# Patient Record
Sex: Male | Born: 1965 | Race: Black or African American | Hispanic: No | Smoking: Never smoker
Health system: Southern US, Community
[De-identification: ages and names within clinical notes are randomized; demographics above are authoritative.]

## PROBLEM LIST (undated history)

## (undated) DIAGNOSIS — E119 Type 2 diabetes mellitus without complications: Secondary | ICD-10-CM

## (undated) HISTORY — PX: FOOT SURGERY: SHX648

---

## 2000-12-04 ENCOUNTER — Emergency Department (HOSPITAL_COMMUNITY): Admission: EM | Admit: 2000-12-04 | Discharge: 2000-12-04 | Payer: Self-pay | Admitting: Emergency Medicine

## 2000-12-04 ENCOUNTER — Encounter: Payer: Self-pay | Admitting: Emergency Medicine

## 2014-03-24 ENCOUNTER — Encounter: Payer: Self-pay | Admitting: Internal Medicine

## 2014-05-10 ENCOUNTER — Encounter: Payer: Self-pay | Admitting: Internal Medicine

## 2014-07-18 ENCOUNTER — Other Ambulatory Visit: Payer: Self-pay | Admitting: Specialist

## 2014-07-18 DIAGNOSIS — M7122 Synovial cyst of popliteal space [Baker], left knee: Secondary | ICD-10-CM

## 2014-07-25 ENCOUNTER — Ambulatory Visit
Admission: RE | Admit: 2014-07-25 | Discharge: 2014-07-25 | Disposition: A | Discharge status: Home / Self Care | Payer: 59 | Source: Ambulatory Visit | Attending: Specialist | Admitting: Specialist

## 2014-07-25 DIAGNOSIS — M7122 Synovial cyst of popliteal space [Baker], left knee: Secondary | ICD-10-CM

## 2016-03-09 IMAGING — US US ASPIRATION
1 series · 13 of 14 positions shown · non-contrast
Comparison: none

CLINICAL DATA: 48-year-old male with posteromedial left knee pain.
Ganglion cyst adjacent to a likely chronic posteromedial corner
injury noted on prior outside MRI. Request for aspiration and
steroid injection.

EXAM:
US ASPIRATION/INJECTION
Date: 07/25/2014
PROCEDURE:
1. Ultrasound-guided aspiration of left knee ganglion cyst
2. Injection of steroid into the ganglion cyst
ANESTHESIA/SEDATION:
None required
MEDICATIONS:
80 mg Depo-Medrol
TECHNIQUE: Informed consent was obtained from the patient following explanation
of the procedure, risks, benefits and alternatives. The patient
understands, agrees and consents for the procedure. All questions
were addressed. A time out was performed.

[Series 1: us aspiration · 0.06mm/px · 14 acquisitions, 13 frames shown]
[im 1/14]
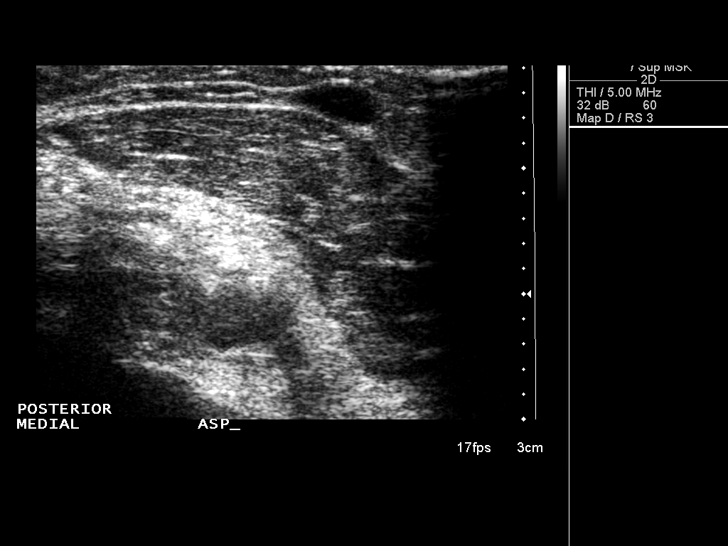
[im 2/14]
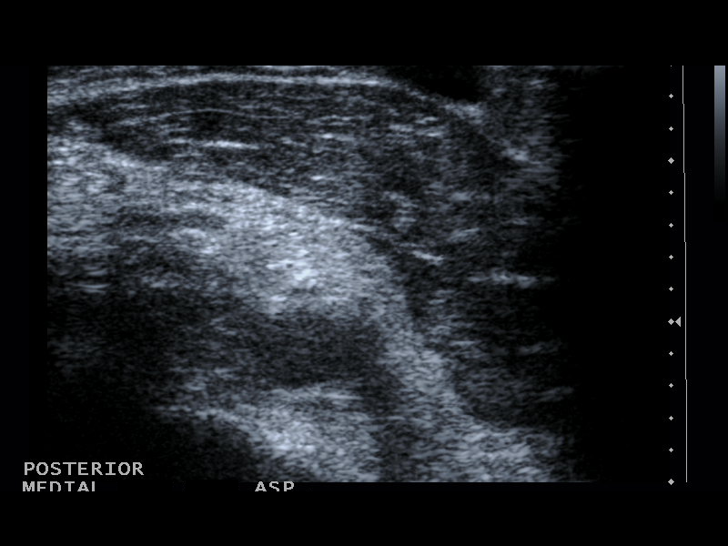
[im 3/14]
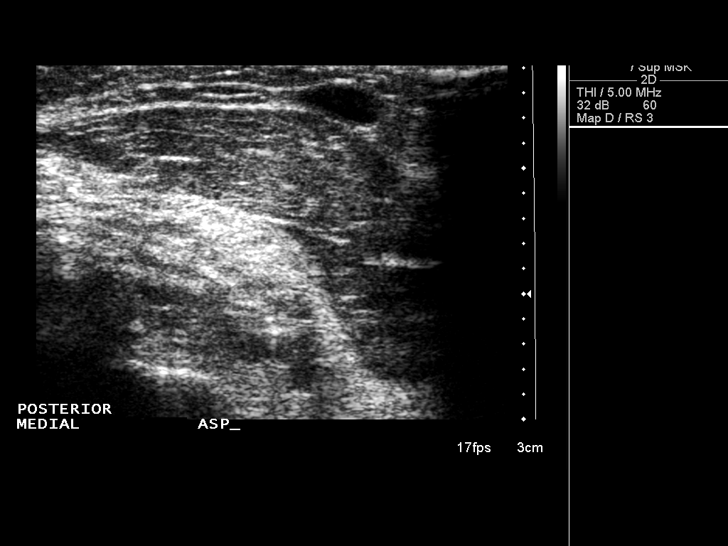
[im 4/14]
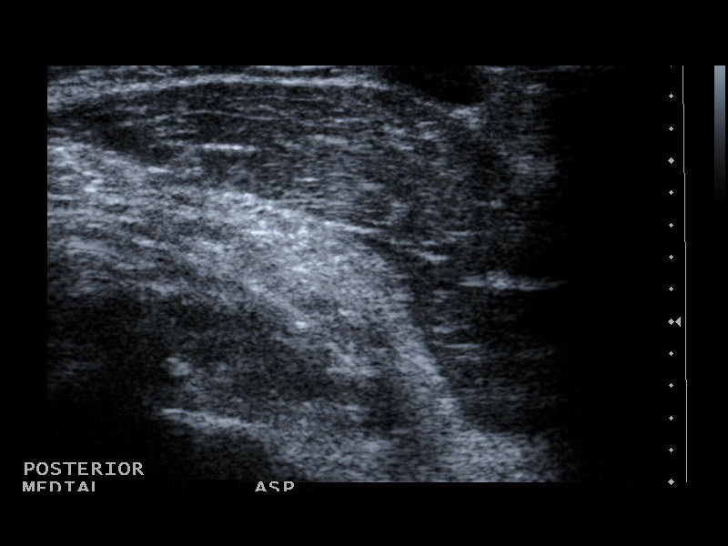
[im 5/14]
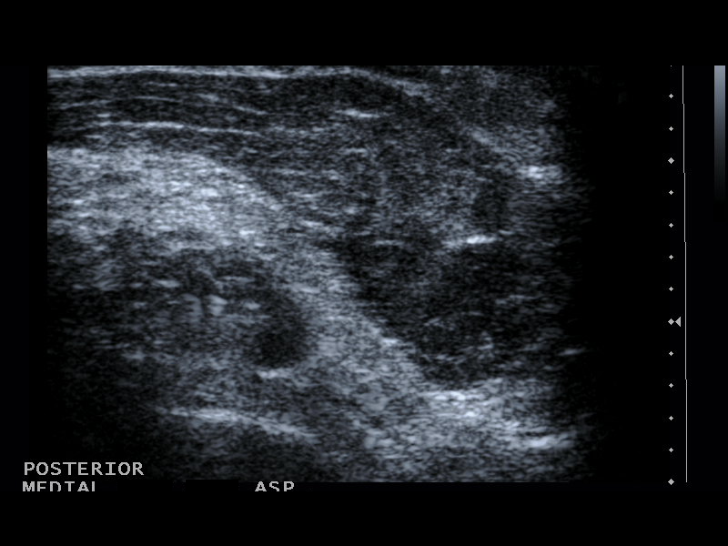
[im 6/14]
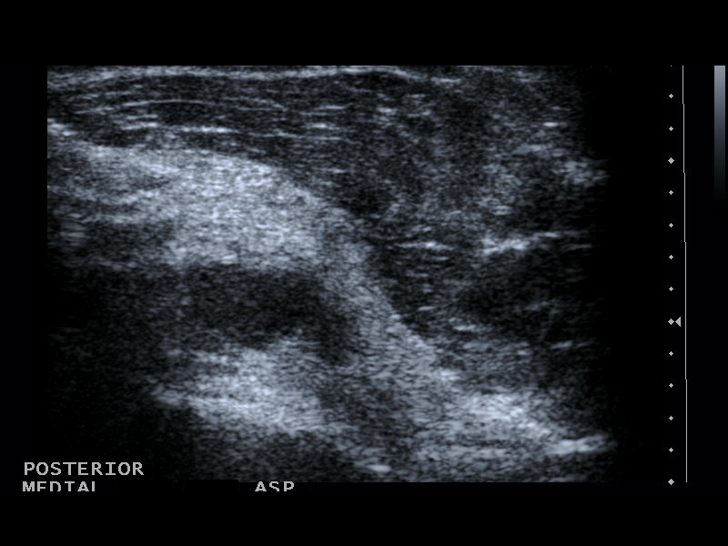
[im 8/14]
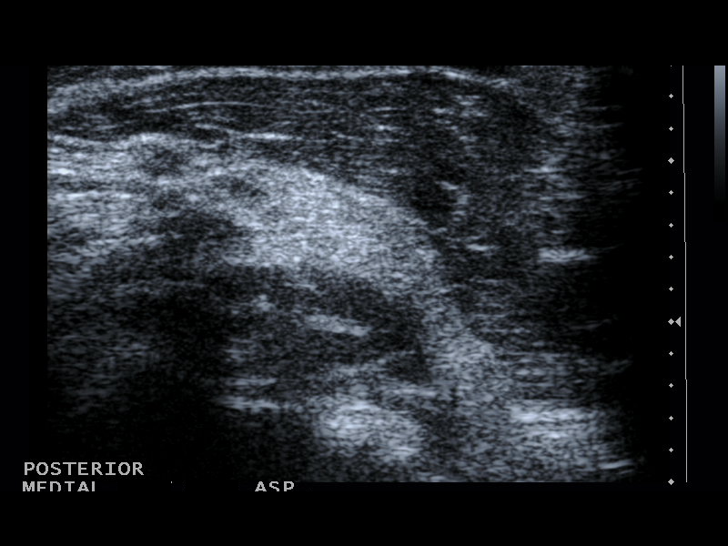
[im 9/14]
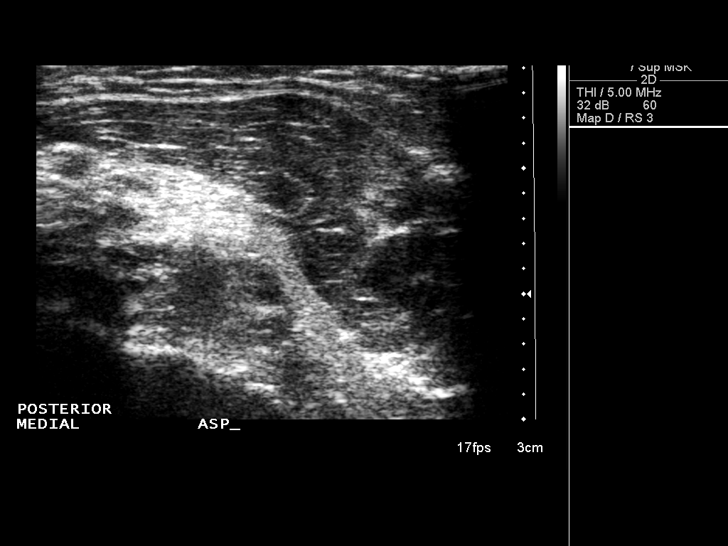
[im 10/14]
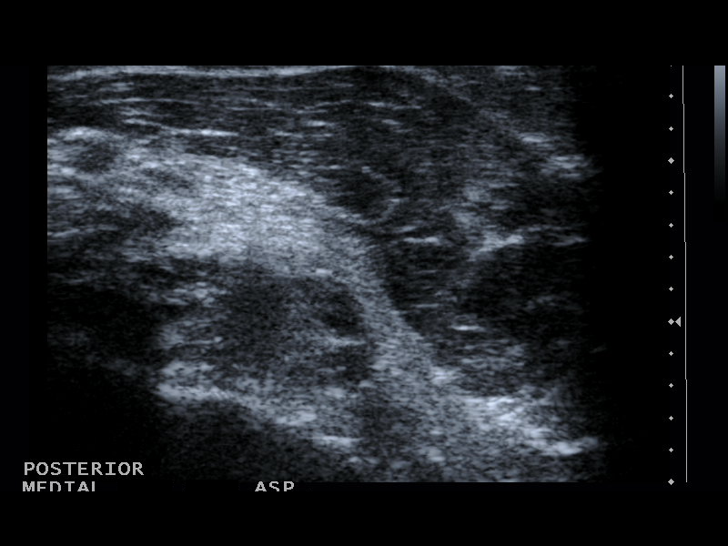
[im 11/14]
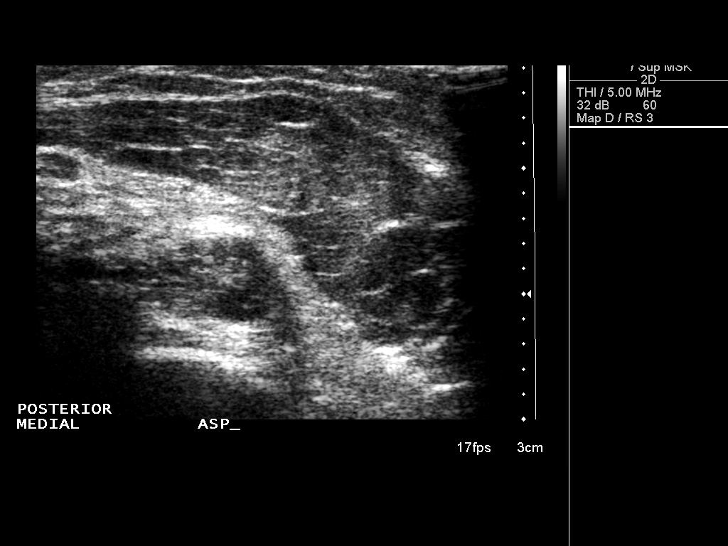
[im 12/14]
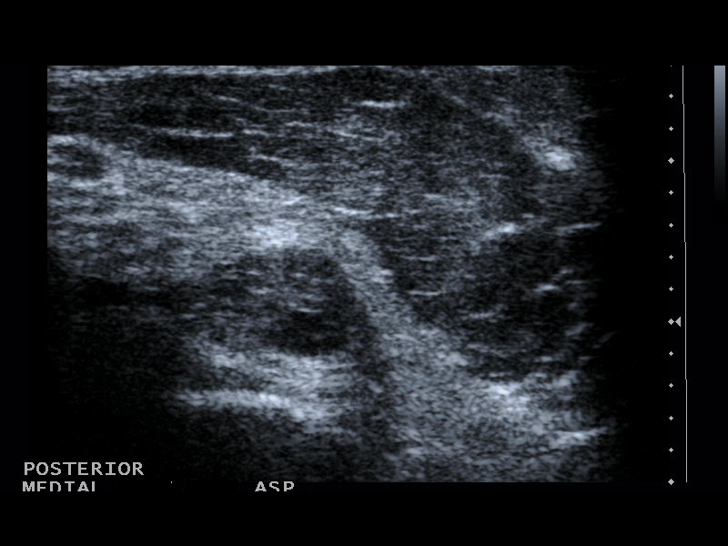
[im 13/14]
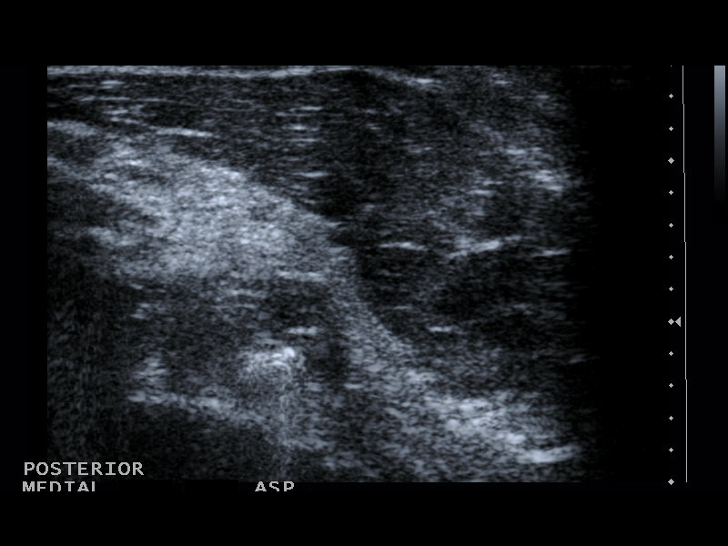
[im 14/14]
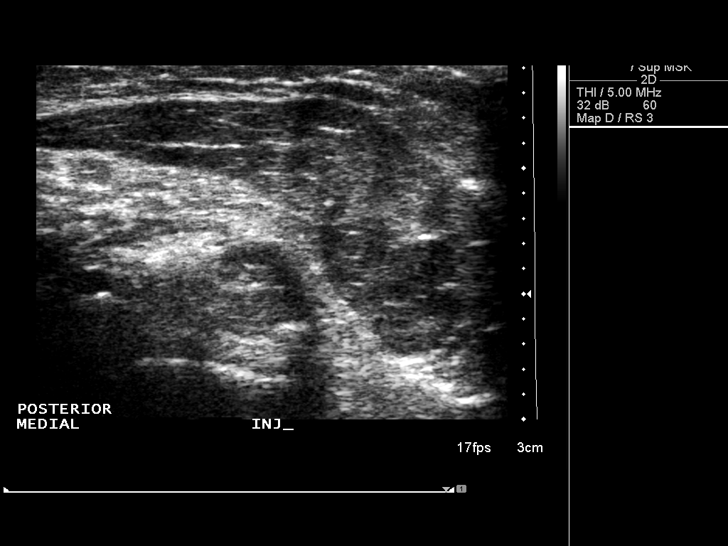

[13 of 14 positions shown; findings below may reference images not displayed]

The posterior aspect of the left knee was performed. There is a 14 x
11 x 9 mm complex cystic collection at the posteromedial corner of
the knee consistent with the report from the outside MRI. A suitable
skin entry site was selected and marked. The region was then
sterilely prepped and draped in standard fashion using Betadine skin
prep. Local anesthesia was attained by infiltration with 1%
lidocaine.

An 18 gauge needle was then carefully advanced under real-time
sonographic guidance into the fluid collection. Aspiration yields a
very small volume of thick, viscous Nawaf Jacobson like material. Visually,
the complex cystic structure is largely collapsed.

Therefore, 1 mL containing 80 mg Depo-Medrol was injected into the
space. The needle was removed and a sterile Band-Aid applied. The
patient tolerated the procedure very well.

COMPLICATIONS:
None
IMPRESSION: Successful aspiration of a small ganglion cyst to the posteromedial
corner of the left knee followed by injection of 80 mg Depo-Medrol.

## 2018-08-26 ENCOUNTER — Emergency Department (HOSPITAL_BASED_OUTPATIENT_CLINIC_OR_DEPARTMENT_OTHER)
Admission: EM | Admit: 2018-08-26 | Discharge: 2018-08-26 | Disposition: A | Discharge status: Home / Self Care | Payer: 59 | Attending: Emergency Medicine | Admitting: Emergency Medicine

## 2018-08-26 ENCOUNTER — Other Ambulatory Visit: Payer: Self-pay

## 2018-08-26 ENCOUNTER — Encounter (HOSPITAL_BASED_OUTPATIENT_CLINIC_OR_DEPARTMENT_OTHER): Payer: Self-pay | Admitting: *Deleted

## 2018-08-26 DIAGNOSIS — R7989 Other specified abnormal findings of blood chemistry: Secondary | ICD-10-CM

## 2018-08-26 DIAGNOSIS — E1165 Type 2 diabetes mellitus with hyperglycemia: Secondary | ICD-10-CM | POA: Diagnosis not present

## 2018-08-26 DIAGNOSIS — R739 Hyperglycemia, unspecified: Secondary | ICD-10-CM

## 2018-08-26 HISTORY — DX: Type 2 diabetes mellitus without complications: E11.9

## 2018-08-26 LAB — BASIC METABOLIC PANEL
Anion gap: 9 (ref 5–15)
BUN: 19 mg/dL (ref 6–20)
CO2: 25 mmol/L (ref 22–32)
Calcium: 9.5 mg/dL (ref 8.9–10.3)
Chloride: 100 mmol/L (ref 98–111)
Creatinine, Ser: 1.38 mg/dL — ABNORMAL HIGH (ref 0.61–1.24)
GFR calc Af Amer: >60 mL/min (ref >60)
GFR calc non Af Amer: 58 mL/min — ABNORMAL LOW (ref >60)
Glucose, Bld: 453 mg/dL — ABNORMAL HIGH (ref 70–99)
Potassium: 3.9 mmol/L (ref 3.5–5.1)
Sodium: 134 mmol/L — ABNORMAL LOW (ref 135–145)

## 2018-08-26 LAB — URINALYSIS, ROUTINE W REFLEX MICROSCOPIC
Bilirubin Urine: NEGATIVE
Glucose, UA: >=500 mg/dL — AB
Hgb urine dipstick: NEGATIVE
Ketones, ur: 15 mg/dL — AB
LEUKOCYTE UA: NEGATIVE
Nitrite: NEGATIVE
PH: 6 (ref 5.0–8.0)
Protein, ur: NEGATIVE mg/dL
Specific Gravity, Urine: 1.01 (ref 1.005–1.030)

## 2018-08-26 LAB — CBC WITH DIFFERENTIAL/PLATELET
Abs Immature Granulocytes: 0.02 10*3/uL (ref 0.00–0.07)
Basophils Absolute: 0 10*3/uL (ref 0.0–0.1)
Basophils Relative: 0 %
EOS ABS: 0.1 10*3/uL (ref 0.0–0.5)
Eosinophils Relative: 1 %
HCT: 50.6 % (ref 39.0–52.0)
Hemoglobin: 17.1 g/dL — ABNORMAL HIGH (ref 13.0–17.0)
Immature Granulocytes: 0 %
Lymphocytes Relative: 35 %
Lymphs Abs: 2.9 10*3/uL (ref 0.7–4.0)
MCH: 30.2 pg (ref 26.0–34.0)
MCHC: 33.8 g/dL (ref 30.0–36.0)
MCV: 89.2 fL (ref 80.0–100.0)
Monocytes Absolute: 0.7 10*3/uL (ref 0.1–1.0)
Monocytes Relative: 8 %
Neutro Abs: 4.7 10*3/uL (ref 1.7–7.7)
Neutrophils Relative %: 56 %
Platelets: 233 10*3/uL (ref 150–400)
RBC: 5.67 MIL/uL (ref 4.22–5.81)
RDW: 11.7 % (ref 11.5–15.5)
WBC: 8.4 10*3/uL (ref 4.0–10.5)
nRBC: 0 % (ref 0.0–0.2)

## 2018-08-26 LAB — URINALYSIS, MICROSCOPIC (REFLEX): WBC, UA: NONE SEEN WBC/hpf (ref 0–5)

## 2018-08-26 LAB — CBG MONITORING, ED
Glucose-Capillary: 434 mg/dL — ABNORMAL HIGH (ref 70–99)
Glucose-Capillary: 384 mg/dL — ABNORMAL HIGH (ref 70–99)

## 2018-08-26 MED ORDER — SODIUM CHLORIDE 0.9 % IV BOLUS
1000.0000 mL | Freq: Once | INTRAVENOUS | Status: AC
  Administered 2018-08-26: 1000 mL via INTRAVENOUS

## 2018-08-26 NOTE — Discharge Instructions (Addendum)
You were seen in the ER today high blood sugar.  Your labs show that your blood sugar is elevated, improved after fluids to 384.  Your creatinine, measure of kidney function, was also somewhat elevated.  Like you to follow-up closely tomorrow with your primary care provider to have your blood sugar and your kidney function monitored and rechecked.  Please discuss diabetic medications with your primary care provider.  Given your consistently elevated blood sugars you likely need an adjustment in your diabetes medications.  Please follow the attached diet guidelines.  Please follow-up closely tomorrow morning.  Return to the ER for new or worsening symptoms or any other concerns.  Results for orders placed or performed during the hospital encounter of 08/26/18  CBC with Differential  Result Value Ref Range   WBC 8.4 4.0 - 10.5 K/uL   RBC 5.67 4.22 - 5.81 MIL/uL   Hemoglobin 17.1 (H) 13.0 - 17.0 g/dL   HCT 45.8 09.9 - 83.3 %   MCV 89.2 80.0 - 100.0 fL   MCH 30.2 26.0 - 34.0 pg   MCHC 33.8 30.0 - 36.0 g/dL   RDW 82.5 05.3 - 97.6 %   Platelets 233 150 - 400 K/uL   nRBC 0.0 0.0 - 0.2 %   Neutrophils Relative % 56 %   Neutro Abs 4.7 1.7 - 7.7 K/uL   Lymphocytes Relative 35 %   Lymphs Abs 2.9 0.7 - 4.0 K/uL   Monocytes Relative 8 %   Monocytes Absolute 0.7 0.1 - 1.0 K/uL   Eosinophils Relative 1 %   Eosinophils Absolute 0.1 0.0 - 0.5 K/uL   Basophils Relative 0 %   Basophils Absolute 0.0 0.0 - 0.1 K/uL   Immature Granulocytes 0 %   Abs Immature Granulocytes 0.02 0.00 - 0.07 K/uL  Basic metabolic panel  Result Value Ref Range   Sodium 134 (L) 135 - 145 mmol/L   Potassium 3.9 3.5 - 5.1 mmol/L   Chloride 100 98 - 111 mmol/L   CO2 25 22 - 32 mmol/L   Glucose, Bld 453 (H) 70 - 99 mg/dL   BUN 19 6 - 20 mg/dL   Creatinine, Ser 7.34 (H) 0.61 - 1.24 mg/dL   Calcium 9.5 8.9 - 19.3 mg/dL   GFR calc non Af Amer 58 (L) >60 mL/min   GFR calc Af Amer >60 >60 mL/min   Anion gap 9 5 - 15  Urinalysis,  Routine w reflex microscopic  Result Value Ref Range   Color, Urine YELLOW YELLOW   APPearance CLEAR CLEAR   Specific Gravity, Urine 1.010 1.005 - 1.030   pH 6.0 5.0 - 8.0   Glucose, UA >=500 (A) NEGATIVE mg/dL   Hgb urine dipstick NEGATIVE NEGATIVE   Bilirubin Urine NEGATIVE NEGATIVE   Ketones, ur 15 (A) NEGATIVE mg/dL   Protein, ur NEGATIVE NEGATIVE mg/dL   Nitrite NEGATIVE NEGATIVE   Leukocytes,Ua NEGATIVE NEGATIVE  CBG monitoring, ED  Result Value Ref Range   Glucose-Capillary 434 (H) 70 - 99 mg/dL  CBG monitoring, ED  Result Value Ref Range   Glucose-Capillary 384 (H) 70 - 99 mg/dL   No results found.

## 2018-08-26 NOTE — ED Provider Notes (Signed)
[facility] EMERGENCY DEPARTMENT Provider Note   CSN: [id number] Arrival date & time: 08/26/18  1634    History   Chief Complaint Chief Complaint  Patient presents with  . Hyperglycemia    HPI Jacob Martinez is a 53 y.o. male with a hx of T2DM who presents to the emergency department with concern for hyperglycemia today.  Patient states he is a type II diabetic currently on 500 mg of metformin twice daily which he has been taking as prescribed.  He states that he checks his blood sugar every other day, he notes that his blood sugar has been in the 400s over the past couple of months, this afternoon he checked it and it read "high". He discussed with primary care who recommended ER visit, he has appointment to see PCP tomorrow AM. In terms of symptoms he notes some mild polyuria without alleviating or aggravating factors.  Otherwise asymptomatic.  Denies fever, chills, URI symptoms, nausea, vomiting, diarrhea, abdominal pain, chest pain, or dyspnea.     HPI  Past Medical History:  Diagnosis Date  . Diabetes mellitus without complication (HCC)     There are no active problems to display for this patient.   Past Surgical History:  Procedure Laterality Date  . FOOT SURGERY          Home Medications    Prior to Admission medications   Not on File    Family History No family history on file.  Social History Social History   Tobacco Use  . Smoking status: Never Smoker  . Smokeless tobacco: Never Used  Substance Use Topics  . Alcohol use: Not Currently  . Drug use: Never     Allergies   Patient has no known allergies.   Review of Systems Review of Systems  Constitutional: Negative for chills and fever.  HENT: Negative for congestion, ear pain and sore throat.   Respiratory: Negative for cough and shortness of breath.   Cardiovascular: Negative for chest pain.  Gastrointestinal: Negative for abdominal pain, diarrhea, nausea and vomiting.   Endocrine: Positive for polyuria.  Genitourinary: Negative for dysuria.  All other systems reviewed and are negative.    Physical Exam Updated Vital Signs BP (!) 142/93   Pulse 69   Temp 98.3 F (36.8 C) (Oral)   Resp 18   Ht 5\' 11"  (1.803 m)   Wt 86.2 kg   SpO2 98%   BMI 26.50 kg/m   Physical Exam Vitals signs and nursing note reviewed.  Constitutional:      General: He is not in acute distress.    Appearance: He is well-developed. He is not toxic-appearing.  HENT:     Head: Normocephalic and atraumatic.  Eyes:     General:        Right eye: No discharge.        Left eye: No discharge.     Conjunctiva/sclera: Conjunctivae normal.  Neck:     Musculoskeletal: Neck supple.  Cardiovascular:     Rate and Rhythm: Normal rate and regular rhythm.  Pulmonary:     Effort: Pulmonary effort is normal. No respiratory distress.     Breath sounds: Normal breath sounds. No wheezing, rhonchi or rales.  Abdominal:     General: There is no distension.     Palpations: Abdomen is soft.     Tenderness: There is no abdominal tenderness.  Skin:    General: Skin is warm and dry.     Findings: No rash.  Neurological:  Mental Status: He is alert.     Comments: Clear speech.   Psychiatric:        Behavior: Behavior normal.      ED Treatments / Results  Labs (all labs ordered are listed, but only abnormal results are displayed) Labs Reviewed  CBC WITH DIFFERENTIAL/PLATELET - Abnormal; Notable for the following components:      Result Value   Hemoglobin 17.1 (*)    All other components within normal limits  BASIC METABOLIC PANEL - Abnormal; Notable for the following components:   Sodium 134 (*)    Glucose, Bld 453 (*)    Creatinine, Ser 1.38 (*)    GFR calc non Af Amer 58 (*)    All other components within normal limits  URINALYSIS, ROUTINE W REFLEX MICROSCOPIC - Abnormal; Notable for the following components:   Glucose, UA >=500 (*)    Ketones, ur 15 (*)    All other  components within normal limits  CBG MONITORING, ED - Abnormal; Notable for the following components:   Glucose-Capillary 434 (*)    All other components within normal limits  CBG MONITORING, ED - Abnormal; Notable for the following components:   Glucose-Capillary 384 (*)    All other components within normal limits  URINALYSIS, MICROSCOPIC (REFLEX)    EKG None  Radiology No results found.  Procedures Procedures (including critical care time)  Medications Ordered in ED Medications - No data to display   Initial Impression / Assessment and Plan / ED Course  I have reviewed the triage vital signs and the nursing notes.  Pertinent labs & imaging results that were available during my care of the patient were reviewed by me and considered in my medical decision making (see chart for details).   Patient is a 53 year old male with a history of type 2 diabetes currently on metformin who presents to the emergency department with concern for hyperglycemia.  Patient nontoxic-appearing, no apparent distress, vitals WNL with the exception of elevated blood pressure, doubt HTN emergency-PCP recheck.  Patient is benign physical exam.  Will obtain basic labs to evaluate for possible DKA and proceed with 1 L of IV fluids.  Work-up reviewed:  CBC: No leukocytosis or anemia BMP: Mild hyponatremia at 134.  Patient is hyperglycemic to 453, he does have 15 ketones in his urine, however anion gap is not elevated and his bicarb is normal, findings not consistent with DKA.  Creatinine is elevated at 1.38-unfortunately no prior on record, he will require primary care for recheck for this. Urinalysis: Glucosuria, mild ketonuria, no UTI.   CBG improved to 384 following fluids. Given he has had blood sugars in the 400s consistently for a few months now will not attempt to aggressively lower in the ER. He has close follow up tomorrow AM with his PCP, anticipate diabetes medication adjustment. I discussed  results, treatment plan, need for follow-up, and return precautions with the patient & his wife. Provided opportunity for questions, patient & his wife confirmed understanding and are in agreement with plan.   Findings and plan of care discussed with supervising physician Dr. Fredderick Phenix who is in agreement.   Final Clinical Impressions(s) / ED Diagnoses   Final diagnoses:  Hyperglycemia  Elevated serum creatinine    ED Discharge Orders    None       Cherly Anderson, PA-C 08/26/18 1906    Rolan Bucco, MD 08/26/18 1952

## 2018-08-26 NOTE — ED Triage Notes (Signed)
Hyperglycemia. States his finger stick read "high".

## 2019-07-29 ENCOUNTER — Ambulatory Visit: Payer: 59 | Attending: Internal Medicine

## 2019-07-29 DIAGNOSIS — Z23 Encounter for immunization: Secondary | ICD-10-CM | POA: Insufficient documentation

## 2019-07-29 NOTE — Progress Notes (Signed)
   Covid-19 Vaccination Clinic  Name:  Jacob Martinez    MRN: [id number] DOB: [birth year]  07/29/2019  [name] was observed post Covid-19 immunization for 15 minutes without incidence. He was provided with Vaccine Information Sheet and instruction to access the V-Safe system.   [name] was instructed to call 911 with any severe reactions post vaccine: Marland Kitchen Difficulty breathing  . Swelling of your face and throat  . A fast heartbeat  . A bad rash all over your body  . Dizziness and weakness    Immunizations Administered    Name Date Dose VIS Date Route   Pfizer COVID-19 Vaccine 07/29/2019 12:42 PM 0.3 mL 05/20/2019 Intramuscular   Manufacturer: [employer], [employer]   Lot: [id number]   NDC: [address]-1

## 2019-08-23 ENCOUNTER — Ambulatory Visit: Payer: 59 | Attending: Internal Medicine

## 2019-08-23 DIAGNOSIS — Z23 Encounter for immunization: Secondary | ICD-10-CM

## 2019-08-23 NOTE — Progress Notes (Signed)
   Covid-19 Vaccination Clinic  Name:  Jacob Martinez    MRN: [id number] DOB: [birth year]  08/23/2019  Mr. Puff was observed post Covid-19 immunization for 15 minutes without incident. He was provided with Vaccine Information Sheet and instruction to access the V-Safe system.   [name] was instructed to call 911 with any severe reactions post vaccine: Marland Kitchen Difficulty breathing  . Swelling of face and throat  . A fast heartbeat  . A bad rash all over body  . Dizziness and weakness   Immunizations Administered    Name Date Dose VIS Date Route   Pfizer COVID-19 Vaccine 08/23/2019  8:51 AM 0.3 mL 05/20/2019 Intramuscular   Manufacturer: [employer], [employer]   Lot: [id number]   NDC: [address]-1
